# Patient Record
Sex: Female | Born: 1995 | Race: Black or African American | Hispanic: No | Marital: Single | Smoking: Never smoker
Health system: Southern US, Community
[De-identification: ages and names within clinical notes are randomized; demographics above are authoritative.]

---
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
## 2021-12-07 ENCOUNTER — Encounter (HOSPITAL_COMMUNITY): Payer: Self-pay

## 2021-12-07 ENCOUNTER — Other Ambulatory Visit: Payer: Self-pay

## 2021-12-07 ENCOUNTER — Emergency Department (HOSPITAL_COMMUNITY)
Admission: EM | Admit: 2021-12-07 | Discharge: 2021-12-07 | Disposition: A | Discharge status: Home / Self Care | Payer: BC Managed Care – PPO | Attending: Emergency Medicine | Admitting: Emergency Medicine

## 2021-12-07 DIAGNOSIS — N750 Cyst of Bartholin's gland: Secondary | ICD-10-CM | POA: Insufficient documentation

## 2021-12-07 DIAGNOSIS — N899 Noninflammatory disorder of vagina, unspecified: Secondary | ICD-10-CM | POA: Diagnosis present

## 2021-12-07 MED ORDER — HYDROCODONE-ACETAMINOPHEN 5-325 MG PO TABS
1.0000 | ORAL_TABLET | Freq: Once | ORAL | Status: AC
  Administered 2021-12-07: 1 via ORAL
  Filled 2021-12-07: qty 1

## 2021-12-07 MED ORDER — AMOXICILLIN-POT CLAVULANATE 875-125 MG PO TABS: 1.0000 | ORAL_TABLET | Freq: Two times a day (BID) | ORAL | 0 refills | Status: AC

## 2021-12-07 MED ORDER — HYDROCODONE-ACETAMINOPHEN 5-325 MG PO TABS: 1.0000 | ORAL_TABLET | ORAL | 0 refills | Status: AC | PRN

## 2021-12-07 NOTE — ED Provider Triage Note (Signed)
Emergency Medicine Provider Triage Evaluation Note  Mirabel Ahlgren , a 26 y.o. female  was evaluated in triage.  Pt complains of vaginal cyst. Pt report she has a cyst to her R labia x 6 months but for the past 4 days it has increased in size and more painful.  No fever, no dysuria  Review of Systems  Positive: As above Negative: As above  Physical Exam  BP 132/85 (BP Location: Right Arm)   Pulse 92   Temp 99.4 F (37.4 C) (Oral)   Resp 18   Ht 5\' 4"  (1.626 m)   Wt 69.4 kg   LMP 11/11/2021 (Exact Date)   SpO2 99%   BMI 26.26 kg/m  Gen:   Awake, no distress   Resp:  Normal effort  MSK:   Moves extremities without difficulty  Other:    Medical Decision Making  Medically screening exam initiated at 4:41 PM.  Appropriate orders placed.  Consuela Widener was informed that the remainder of the evaluation will be completed by another provider, this initial triage assessment does not replace that evaluation, and the importance of remaining in the ED until their evaluation is complete.     Wynetta Emery, PA-C 12/07/21 1644

## 2021-12-07 NOTE — Discharge Instructions (Addendum)
Continue to soak in warm tub 2 or 3 times a day to help improve your discomfort.  We are referring you to a gynecologist to see for further evaluation and treatment.  Do not drive or work when taking the narcotic pain reliever.

## 2021-12-07 NOTE — ED Provider Notes (Signed)
[facility]-EMERGENCY DEPT Provider Note   CSN: [id number] Arrival date & time: 12/07/21  1541     History {Add pertinent medical, surgical, social history, OB history to HPI:1} Chief Complaint  Patient presents with   vaginal cyst    Tracy Mendoza is a 25 y.o. female.  HPI     Home Medications Prior to Admission medications   Medication Sig Start Date End Date Taking? Authorizing Provider  albuterol (PROVENTIL HFA;VENTOLIN HFA) 108 (90 BASE) MCG/ACT inhaler Inhale 2 puffs into the lungs every evening.     [provider]  albuterol (PROVENTIL) (2.5 MG/3ML) 0.083% nebulizer solution Take 2.5 mg by nebulization every 6 (six) hours as needed for wheezing.    [provider]  HYDROcodone-acetaminophen (NORCO/VICODIN) 5-325 MG per tablet Take 1 tablet by mouth every 4 (four) hours as needed for pain. 01/03/13   Piepenbrink, Victorino Dike, PA-C      Allergies    Banana and Other    Review of Systems   Review of Systems  Physical Exam Updated Vital Signs BP 123/89   Pulse (!) 57   Temp 99.4 F (37.4 C) (Oral)   Resp 15   Ht 5\' 4"  (1.626 m)   Wt 69.4 kg   LMP 11/11/2021 (Exact Date)   SpO2 100%   BMI 26.26 kg/m  Physical Exam Vitals and nursing note reviewed.  Constitutional:      Appearance: She is well-developed. She is not ill-appearing.  HENT:     Head: Normocephalic and atraumatic.     Right Ear: External ear normal.     Left Ear: External ear normal.  Eyes:     Conjunctiva/sclera: Conjunctivae normal.     Pupils: Pupils are equal, round, and reactive to light.  Neck:     Trachea: Phonation normal.  Cardiovascular:     Rate and Rhythm: Normal rate.  Pulmonary:     Effort: Pulmonary effort is normal.  Abdominal:     General: There is no distension.     Palpations: Abdomen is soft.  Genitourinary:    Comments: External genitalia normal except there is a small mass on the right mid lower labia.  This mass is  approximately 1 x 2 x 2 cm.  There is no pointing or draining.  The mass is mildly tender.  This appears to be a Bartholin gland, slightly swollen but not frankly infected.  This is not amenable to I&D, in the emergency setting. Musculoskeletal:        General: Normal range of motion.     Cervical back: Normal range of motion and neck supple.  Skin:    General: Skin is warm and dry.  Neurological:     Mental Status: She is alert and oriented to person, place, and time.     Cranial Nerves: No cranial nerve deficit.     Sensory: No sensory deficit.     Motor: No abnormal muscle tone.     Coordination: Coordination normal.  Psychiatric:        Mood and Affect: Mood normal.        Behavior: Behavior normal.        Thought Content: Thought content normal.        Judgment: Judgment normal.     ED Results / Procedures / Treatments   Labs (all labs ordered are listed, but only abnormal results are displayed) Labs Reviewed  POC URINE PREG, ED    EKG EKG Interpretation  Date/Time:  Tuesday December 07 2021 19:35:07 EDT Ventricular Rate:  77 PR Interval:  147 QRS Duration: 90 QT Interval:  379 QTC Calculation: 429 R Axis:   62 Text Interpretation: Sinus rhythm No old tracing to compare Confirmed by Mancel Bale [phone]) on 12/07/2021 8:57:21 PM  Radiology No results found.  Procedures Procedures  {Document cardiac monitor, telemetry assessment procedure when appropriate:1}  Medications Ordered in ED Medications  HYDROcodone-acetaminophen (NORCO/VICODIN) 5-325 MG per tablet 1 tablet (1 tablet Oral Given 12/07/21 2011)    ED Course/ Medical Decision Making/ A&P                           Medical Decision Making  ***  {Document critical care time when appropriate:1} {Document review of labs and clinical decision tools ie heart score, Chads2Vasc2 etc:1}  {Document your independent review of radiology images, and any outside records:1} {Document your discussion with family  members, caretakers, and with consultants:1} {Document social determinants of health affecting pt's care:1} {Document your decision making why or why not admission, treatments were needed:1} Final Clinical Impression(s) / ED Diagnoses Final diagnoses:  None    Rx / DC Orders ED Discharge Orders     None

## 2021-12-07 NOTE — ED Triage Notes (Signed)
Patient c/o a right vaginal cyst x 1 month, but worsening I the past few days.
